# Patient Record
Sex: Male | Born: 1985 | Race: Black or African American | Hispanic: No | Marital: Single | Smoking: Current every day smoker
Health system: Southern US, Community
[De-identification: ages and names within clinical notes are randomized; demographics above are authoritative.]

## PROBLEM LIST (undated history)

## (undated) DIAGNOSIS — J45909 Unspecified asthma, uncomplicated: Secondary | ICD-10-CM

---

## 2015-05-22 ENCOUNTER — Emergency Department (HOSPITAL_COMMUNITY): Payer: Medicaid - Out of State

## 2015-05-22 ENCOUNTER — Emergency Department (HOSPITAL_COMMUNITY)
Admission: EM | Admit: 2015-05-22 | Discharge: 2015-05-22 | Disposition: A | Discharge status: Home / Self Care | Payer: Medicaid - Out of State | Attending: Emergency Medicine | Admitting: Emergency Medicine

## 2015-05-22 ENCOUNTER — Encounter (HOSPITAL_COMMUNITY): Payer: Self-pay | Admitting: Emergency Medicine

## 2015-05-22 DIAGNOSIS — Y929 Unspecified place or not applicable: Secondary | ICD-10-CM | POA: Diagnosis not present

## 2015-05-22 DIAGNOSIS — Y999 Unspecified external cause status: Secondary | ICD-10-CM | POA: Insufficient documentation

## 2015-05-22 DIAGNOSIS — X58XXXA Exposure to other specified factors, initial encounter: Secondary | ICD-10-CM | POA: Insufficient documentation

## 2015-05-22 DIAGNOSIS — J45909 Unspecified asthma, uncomplicated: Secondary | ICD-10-CM | POA: Diagnosis not present

## 2015-05-22 DIAGNOSIS — S0993XA Unspecified injury of face, initial encounter: Secondary | ICD-10-CM | POA: Diagnosis present

## 2015-05-22 DIAGNOSIS — S030XXA Dislocation of jaw, initial encounter: Secondary | ICD-10-CM | POA: Diagnosis not present

## 2015-05-22 DIAGNOSIS — Y939 Activity, unspecified: Secondary | ICD-10-CM | POA: Diagnosis not present

## 2015-05-22 DIAGNOSIS — R6884 Jaw pain: Secondary | ICD-10-CM

## 2015-05-22 DIAGNOSIS — S0300XA Dislocation of jaw, unspecified side, initial encounter: Secondary | ICD-10-CM

## 2015-05-22 DIAGNOSIS — Z79899 Other long term (current) drug therapy: Secondary | ICD-10-CM | POA: Diagnosis not present

## 2015-05-22 HISTORY — DX: Unspecified asthma, uncomplicated: J45.909

## 2015-05-22 MED ORDER — PROPOFOL 10 MG/ML IV BOLUS
INTRAVENOUS | Status: AC | PRN
  Administered 2015-05-22: 20 mg via INTRAVENOUS

## 2015-05-22 MED ORDER — HYDROMORPHONE HCL 1 MG/ML IJ SOLN
0.5000 mg | Freq: Once | INTRAMUSCULAR | Status: AC
  Administered 2015-05-22: 0.5 mg via INTRAVENOUS

## 2015-05-22 MED ORDER — PROPOFOL 10 MG/ML IV BOLUS
INTRAVENOUS | Status: AC
  Filled 2015-05-22: qty 20

## 2015-05-22 MED ORDER — HYDROMORPHONE HCL 1 MG/ML IJ SOLN
INTRAMUSCULAR | Status: AC
  Filled 2015-05-22: qty 1

## 2015-05-22 MED ORDER — ONDANSETRON HCL 4 MG/2ML IJ SOLN
INTRAMUSCULAR | Status: AC
  Filled 2015-05-22: qty 2

## 2015-05-22 MED ORDER — HYDROMORPHONE HCL 1 MG/ML IJ SOLN
1.0000 mg | Freq: Once | INTRAMUSCULAR | Status: AC
  Administered 2015-05-22: 1 mg via INTRAVENOUS
  Filled 2015-05-22: qty 1

## 2015-05-22 MED ORDER — ONDANSETRON HCL 4 MG/2ML IJ SOLN
4.0000 mg | Freq: Once | INTRAMUSCULAR | Status: AC
  Administered 2015-05-22: 4 mg via INTRAVENOUS

## 2015-05-22 MED ORDER — PROPOFOL 10 MG/ML IV BOLUS
INTRAVENOUS | Status: AC | PRN
  Administered 2015-05-22: 40 mg via INTRAVENOUS

## 2015-05-22 NOTE — Discharge Instructions (Signed)
Jaw Dislocation °A jaw dislocation is the displacement of the joint where the upper jaw bone (maxilla) and the lower jaw bone (mandibula) meet (temporomandibular joint). Soon after the dislocation, the jaw muscles tighten. This prevents the mouth from closing normally.  °CAUSES °A jaw dislocation usually is caused by a sudden forceful impact to the jaw. A strong punch in the jaw during a fist fight or a sports injury are examples of causes of jaw dislocation. Another cause is injury due to car or motorcycle accidents. °RISK FACTORS °Although anyone can have a jaw dislocation, some people are more at risk than others. People at increased risk for jaw dislocation include participants in contact sports. °SYMPTOMS °Symptoms of jaw dislocation can vary, depending on the severity of the dislocation. They can include: °· Feeling that your teeth are out of alignment when you bite. °· Inability to close your mouth completely. °· Drooling. °· Extreme pain, with the inability to move your jaw. °DIAGNOSIS °Your caregiver will feel your temporomandibular joints and ask you to move your jaw. Your caregiver also will feel the inside of your mouth to make sure there are no fractures or cuts (lacerations). °TREATMENT °Your caregiver will manipulate your jaw to put it back into place (reduction). If you have any jaw fractures from the dislocation, they usually will be held in place with plates and screws or with wiring.  °HOME CARE INSTRUCTIONS °The following measures can help to reduce pain and hasten the healing process: °· Rest your injured joint. Do not move it. Avoid activities similar to the one that caused your injury. °· Apply ice to your injured joint for 1 to 2 days after your reduction or as directed by your caregiver. Applying ice helps to reduce inflammation and pain. °¨ Put ice in a plastic bag. °¨ Place a towel between your skin and the bag. °¨ Leave the ice on for 15-20 minutes at a time, 03-04 times a day. °· Take  over-the-counter or prescription medication for pain as directed by your caregiver. °Also, your caregiver may instruct you to only have certain foods until your jaw heals. These foods may be soft or liquified so that your jaw does not have to move much to eat them. °SEEK IMMEDIATE MEDICAL CARE IF: °· You have plates and screws or wiring to hold your jaw together that becomes loose or damaged. °· You develop drainage from any of the cuts (incisions) where your wires or plates and screws were placed. °· Your pain becomes worse rather than better. °MAKE SURE YOU: °· Understand these instructions. °· Will watch your condition. °· Will get help right away if you are not doing well or get worse. °Document Released: 12/07/2000 Document Revised: 03/03/2012 Document Reviewed: 05/10/2011 °Exit[employer]® Patient Information ©2015 [employer]. This information is not intended to replace advice given to you by your health care provider. Make sure you discuss any questions you have with your health care provider. ° °

## 2015-05-22 NOTE — ED Notes (Signed)
Given print out of bus route and bus pass along with google map directions of how to get to girlfriends apt after getting off the bus.

## 2015-05-22 NOTE — ED Notes (Signed)
Dislocated jaw s/p yawn, hx of same once before.  Call went out at 0145, 200 fentanyl given en route, ems was with patient for extended period of time due to lack of driver.

## 2015-05-22 NOTE — ED Provider Notes (Signed)
CSN: [id number]     Arrival date & time 05/22/15  [id number] History   First MD Initiated Contact with Patient 05/22/15 0322     This chart was scribed for Loren Raceravid Eziah Negro, MD by Arlan OrganAshley Leger, ED Scribe. This patient was seen in room [facility] and the patient's care was started 3:31 AM.   Chief Complaint  Patient presents with  . Facial Injury   The history is provided by the patient. No language interpreter was used.    HPI Comments: Tommy Howard brought in by EMS is a 29 y.o. male without any pertinent past medical history who presents to the Emergency Department complaining of constant,ongoing, unchanged bilateral jaw pain x 2 hours. Pt was given 200 of fentanyl en route to department. He is unable to close his mouth completely at this time. Tommy Howard admits to previous jaw dislocation approximately 1 year ago. No known allergies to medications. Per EMS patient's symptoms started after yawning. No trauma.  Past Medical History  Diagnosis Date  . Asthma    History reviewed. No pertinent past surgical history. No family history on file. History  Substance Use Topics  . Smoking status: Not on file  . Smokeless tobacco: Not on file  . Alcohol Use: Not on file    Review of Systems  Constitutional: Negative for fever and chills.  HENT: Negative for facial swelling.   Gastrointestinal: Negative for nausea and vomiting.  Musculoskeletal: Positive for arthralgias.  Skin: Negative for rash.  Psychiatric/Behavioral: Negative for confusion.  All other systems reviewed and are negative.     Allergies  Review of patient's allergies indicates no known allergies.  Home Medications   Prior to Admission medications   Medication Sig Start Date End Date Taking? Authorizing Provider  albuterol (PROVENTIL HFA;VENTOLIN HFA) 108 (90 BASE) MCG/ACT inhaler Inhale 2 puffs into the lungs every 6 (six) hours as needed for wheezing or shortness of breath.   Yes Historical Provider, MD   albuterol (PROVENTIL) (2.5 MG/3ML) 0.083% nebulizer solution Take 2.5 mg by nebulization every 6 (six) hours as needed for wheezing or shortness of breath.   Yes Historical Provider, MD   Triage Vitals: BP 129/72 mmHg  Pulse 50  Temp(Src) 97.8 F (36.6 C) (Oral)  Resp 13  Ht 5\' 9"  (1.753 m)  Wt 135 lb (61.236 kg)  BMI 19.93 kg/m2  SpO2 100%   Physical Exam  Constitutional: He is oriented to person, place, and time. He appears well-developed and well-nourished. No distress.  HENT:  Head: Normocephalic and atraumatic.  Mouth/Throat: Oropharynx is clear and moist.  Patient unable to close mouth. No obvious swelling or deformity. Face is symmetric  Eyes: EOM are normal. Pupils are equal, round, and reactive to light.  Neck: Normal range of motion. Neck supple.  Cardiovascular: Normal rate and regular rhythm.   Pulmonary/Chest: Effort normal and breath sounds normal. No respiratory distress. He has no wheezes. He has no rales.  Abdominal: Soft. Bowel sounds are normal. He exhibits no distension and no mass. There is no tenderness. There is no rebound and no guarding.  Musculoskeletal: Normal range of motion. He exhibits no edema or tenderness.  Neurological: He is alert and oriented to person, place, and time.  Moves all extremities. Sensation fully intact.  Skin: Skin is warm and dry. No rash noted. No erythema.  Psychiatric: He has a normal mood and affect. His behavior is normal.  Nursing note and vitals reviewed.   ED Course  Reduction of dislocation Date/Time: 05/22/2015  6:58 AM Performed by: Loren Racer Authorized by: Loren Racer Consent: Verbal consent obtained. Written consent obtained. Patient sedated: yes Sedatives: propofol Sedation start date/time: 05/22/2015 6:15 AM Sedation end date/time: 05/22/2015 6:25 AM Patient tolerance: Patient tolerated the procedure well with no immediate complications Comments: Bilateral mandibular condyle reduction. Patient has  full range of motion of the jaw. No malocclusion.   (including critical care time)  DIAGNOSTIC STUDIES: Oxygen Saturation is 100% on RA, Normal by my interpretation.    COORDINATION OF CARE: 3:32 AM- Will give Dilaudid. Will order DG orthopantogram. Discussed treatment plan with pt at bedside and pt agreed to plan.     Labs Review Labs Reviewed - No data to display  Imaging Review Dg Orthopantogram  05/22/2015   CLINICAL DATA:  Dislocated jaw when E on Ing.  Jaw pain.  EXAM: ORTHOPANTOGRAM/PANORAMIC  COMPARISON:  None.  FINDINGS: There is no evidence of fracture. Temporomandibular joint location is not well established by this technique. No evidence of mandibular fracture.  Lower molar cavities with extensive erosion to the right first molar. There is also a large cavity in the left first mandibular molar, which also has periapical erosion. Periapical erosion also seen around the terminal left lower molar.  IMPRESSION: 1. No negative for mandible fracture. 2. Mandibular molar caries.   Electronically Signed   By: Marnee Spring M.D.   On: 05/22/2015 04:30   Ct Maxillofacial Wo Cm  05/22/2015   CLINICAL DATA:  Facial injury with inability to close mouth. History of previous mandible dislocation.  EXAM: CT MAXILLOFACIAL WITHOUT CONTRAST  TECHNIQUE: Multidetector CT imaging of the maxillofacial structures was performed. Multiplanar CT image reconstructions were also generated. A small metallic BB was placed on the right temple in order to reliably differentiate right from left.  COMPARISON:  None.  FINDINGS: Symmetric anterior translation of the temporomandibular joints, fixed dislocation given the history. There is no fracture or other visible impediment to reduction. No degenerative changes to the TMJs.  There are multiple cavities and periapical erosions, most extensive in the bilateral mandibular molars. There is also a notably large cavity of the left upper first premolar, with periapical  erosion.  Clear paranasal sinuses. No evidence of globe or other orbital pathology. Limited intracranial imaging is negative.  IMPRESSION: Bilateral anterior TMJ dislocation.  No acute fracture.   Electronically Signed   By: Marnee Spring M.D.   On: 05/22/2015 05:58     EKG Interpretation None      MDM   Final diagnoses:  Jaw pain  TMJ (dislocation of temporomandibular joint), initial encounter    I personally performed the services described in this documentation, which was scribed in my presence. The recorded information has been reviewed and is accurate.  Patient with mild hypoxia during sedation which responded to supplemental oxygen with bag valve mask.  Patient is now alert. Speaking clearly. No malocclusion. Discharge home.  Loren Racer, MD 05/22/15 [phone]

## 2015-09-19 ENCOUNTER — Emergency Department (HOSPITAL_COMMUNITY): Payer: Medicaid - Out of State

## 2015-09-19 ENCOUNTER — Encounter (HOSPITAL_COMMUNITY): Payer: Self-pay | Admitting: Emergency Medicine

## 2015-09-19 ENCOUNTER — Emergency Department (HOSPITAL_COMMUNITY)
Admission: EM | Admit: 2015-09-19 | Discharge: 2015-09-19 | Disposition: A | Discharge status: Home / Self Care | Payer: Medicaid - Out of State | Attending: Emergency Medicine | Admitting: Emergency Medicine

## 2015-09-19 DIAGNOSIS — Z720 Tobacco use: Secondary | ICD-10-CM | POA: Diagnosis not present

## 2015-09-19 DIAGNOSIS — Z79899 Other long term (current) drug therapy: Secondary | ICD-10-CM | POA: Diagnosis not present

## 2015-09-19 DIAGNOSIS — R6884 Jaw pain: Secondary | ICD-10-CM | POA: Diagnosis present

## 2015-09-19 DIAGNOSIS — K029 Dental caries, unspecified: Secondary | ICD-10-CM | POA: Diagnosis not present

## 2015-09-19 DIAGNOSIS — M2660 Temporomandibular joint disorder, unspecified: Secondary | ICD-10-CM | POA: Insufficient documentation

## 2015-09-19 DIAGNOSIS — J45909 Unspecified asthma, uncomplicated: Secondary | ICD-10-CM | POA: Diagnosis not present

## 2015-09-19 DIAGNOSIS — M26629 Arthralgia of temporomandibular joint, unspecified side: Secondary | ICD-10-CM

## 2015-09-19 MED ORDER — KETOROLAC TROMETHAMINE 60 MG/2ML IM SOLN
60.0000 mg | Freq: Once | INTRAMUSCULAR | Status: AC
  Administered 2015-09-19: 60 mg via INTRAMUSCULAR
  Filled 2015-09-19: qty 2

## 2015-09-19 MED ORDER — ETODOLAC 500 MG PO TABS: 500.0000 mg | ORAL_TABLET | Freq: Two times a day (BID) | ORAL | Status: AC

## 2015-09-19 NOTE — ED Provider Notes (Signed)
CSN: [id number]     Arrival date & time 09/19/15  4098 History   First MD Initiated Contact with Patient 09/19/15 [phone]     Chief Complaint  Patient presents with  . Jaw Pain   HPI Patient presents to the emergency room complaining of right-sided jaw pain.  Patient states he dislocated his jaw yesterday. There was no trauma he just felt like it popped out of place. Patient was able to relocate his jaw on his own. Since yesterday he's continued to have a painful popping discomfort on the right side of his jaw. Movement increases the pain. Patient called 911 to be brought to the emergency room. They observed the patient yawning without injury. Patient denies any fevers or chills. He denies any toothache. He denies any earache. Past Medical History  Diagnosis Date  . Asthma    History reviewed. No pertinent past surgical history. History reviewed. No pertinent family history. Social History  Substance Use Topics  . Smoking status: Current Every Day Smoker    Types: Cigars  . Smokeless tobacco: Never Used  . Alcohol Use: No    Review of Systems  All other systems reviewed and are negative.     Allergies  Review of patient's allergies indicates no known allergies.  Home Medications   Prior to Admission medications   Medication Sig Start Date End Date Taking? Authorizing Provider  acetaminophen (TYLENOL) 500 MG tablet Take 2,000 mg by mouth every 6 (six) hours as needed for mild pain, moderate pain or headache.   Yes Historical Provider, MD  albuterol (PROVENTIL HFA;VENTOLIN HFA) 108 (90 BASE) MCG/ACT inhaler Inhale 2 puffs into the lungs every 6 (six) hours as needed for wheezing or shortness of breath.   Yes Historical Provider, MD  albuterol (PROVENTIL) (2.5 MG/3ML) 0.083% nebulizer solution Take 2.5 mg by nebulization every 6 (six) hours as needed for wheezing or shortness of breath.   Yes Historical Provider, MD  etodolac (LODINE) 500 MG tablet Take 1 tablet (500 mg total) by  mouth 2 (two) times daily. 09/19/15   Linwood Dibbles, MD   BP 107/66 mmHg  Pulse 68  Temp(Src) 98.1 F (36.7 C) (Oral)  Resp 14  Ht  (1.753 m)  Wt 135 lb (61.236 kg)  BMI 19.93 kg/m2  SpO2 96% Physical Exam  Constitutional: He appears well-developed and well-nourished. No distress.  HENT:  Head: Normocephalic and atraumatic.  Right Ear: Tympanic membrane and external ear normal.  Left Ear: Tympanic membrane and external ear normal.  Mouth/Throat: Mucous membranes are normal. No oral lesions. No trismus in the jaw. Dental caries (no dental tenderness) present. No uvula swelling. No oropharyngeal exudate, posterior oropharyngeal edema, posterior oropharyngeal erythema or tonsillar abscesses.  Tenderness to palpation right TMJ, no erythema or swelling, no malocclusion  Eyes: Conjunctivae are normal. Right eye exhibits no discharge. Left eye exhibits no discharge. No scleral icterus.  Neck: Neck supple. No tracheal deviation present.  Cardiovascular: Normal rate.   Pulmonary/Chest: Effort normal. No stridor. No respiratory distress.  Musculoskeletal: He exhibits no edema.  Neurological: He is alert. Cranial nerve deficit: no gross deficits.  Skin: Skin is warm and dry. No rash noted.  Psychiatric: He has a normal mood and affect.  Nursing note and vitals reviewed.   ED Course  Procedures (including critical care time)    Imaging Review Dg Mandible 1-3 Views  09/19/2015   CLINICAL DATA:  History of mandible dislocation 2 years ago and felt like it pop out yesterday.  Right sided mandibular pain.  EXAM: MANDIBLE - 1-3 VIEW  COMPARISON:  None.  FINDINGS: There is no evidence of fracture or other focal bone lesions. Visualized paranasal sinuses are aerated. Mandibular condyles appear to be located.  IMPRESSION: No acute abnormality.   Electronically Signed   By: Richarda Overlie M.D.   On: 09/19/2015 08:12     MDM   Final diagnoses:  Temporomandibular joint (TMJ) pain   X-rays do not  show any abnormality. Exam does not suggest any infection or other referred pain. Patient may have TMJ joint dysfunction. I recommend follow-up with a dentist. Prescription for NSAIDs for pain relief.  At this time there does not appear to be any evidence of an acute emergency medical condition and the patient appears stable for discharge with appropriate outpatient follow up.     Linwood Dibbles, MD 09/19/15 [phone]

## 2015-09-19 NOTE — Discharge Instructions (Signed)
Temporomandibular Problems  Temporomandibular joint (TMJ) dysfunction means there are problems with the joint between your jaw and your skull. This is a joint lined by cartilage like other joints in your body but also has a small disc in the joint which keeps the bones from rubbing on each other. These joints are like other joints and can get inflamed (sore) from arthritis and other problems. When this joint gets sore, it can cause headaches and pain in the jaw and the face. CAUSES  Usually the arthritic types of problems are caused by soreness in the joint. Soreness in the joint can also be caused by overuse. This may come from grinding your teeth. It may also come from mis-alignment in the joint. DIAGNOSIS Diagnosis of this condition can often be made by history and exam. Sometimes your caregiver may need X-rays or an MRI scan to determine the exact cause. It may be necessary to see your dentist to determine if your teeth and jaws are lined up correctly. TREATMENT  Most of the time this problem is not serious; however, sometimes it can persist (become chronic). When this happens medications that will cut down on inflammation (soreness) help. Sometimes a shot of cortisone into the joint will be helpful. If your teeth are not aligned it may help for your dentist to make a splint for your mouth that can help this problem. If no physical problems can be found, the problem may come from tension. If tension is found to be the cause, biofeedback or relaxation techniques may be helpful. HOME CARE INSTRUCTIONS   Later in the day, applications of ice packs may be helpful. Ice can be used in a plastic bag with a towel around it to prevent frostbite to skin. This may be used about every 2 hours for 20 to 30 minutes, as needed while awake, or as directed by your caregiver.  Only take over-the-counter or prescription medicines for pain, discomfort, or fever as directed by your caregiver.  If physical therapy was  prescribed, follow your caregiver's directions.  Wear mouth appliances as directed if they were given. Document Released: 09/04/2001 Document Revised: 03/03/2012 Document Reviewed: 12/12/2008 Exit[employer] Patient Information 2015 [employer]. This information is not intended to replace advice given to you by your health care provider. Make sure you discuss any questions you have with your health care provider.  

## 2015-09-19 NOTE — ED Notes (Addendum)
Per EMS, pt says he dislocated his jaw and put it back yesterday. Now it's popping and painful to the R side. Ambulatory on scene for EMS, who also states pt yawning in their presence without painful reaction.

## 2015-09-19 NOTE — ED Notes (Signed)
Patient transported to X-ray 

## 2015-09-20 ENCOUNTER — Encounter (HOSPITAL_COMMUNITY): Payer: Self-pay | Admitting: Emergency Medicine

## 2015-09-20 ENCOUNTER — Emergency Department (HOSPITAL_COMMUNITY)
Admission: EM | Admit: 2015-09-20 | Discharge: 2015-09-20 | Disposition: A | Discharge status: Home / Self Care | Payer: Medicaid - Out of State | Attending: Emergency Medicine | Admitting: Emergency Medicine

## 2015-09-20 DIAGNOSIS — R6884 Jaw pain: Secondary | ICD-10-CM

## 2015-09-20 DIAGNOSIS — J45909 Unspecified asthma, uncomplicated: Secondary | ICD-10-CM | POA: Diagnosis not present

## 2015-09-20 DIAGNOSIS — Z720 Tobacco use: Secondary | ICD-10-CM | POA: Insufficient documentation

## 2015-09-20 DIAGNOSIS — Z79899 Other long term (current) drug therapy: Secondary | ICD-10-CM | POA: Diagnosis not present

## 2015-09-20 MED ORDER — KETOROLAC TROMETHAMINE 30 MG/ML IJ SOLN
60.0000 mg | Freq: Once | INTRAMUSCULAR | Status: AC
  Administered 2015-09-20: 60 mg via INTRAMUSCULAR
  Filled 2015-09-20: qty 2

## 2015-09-20 NOTE — ED Provider Notes (Signed)
CSN: [id number]     Arrival date & time 09/20/15  4098 History   First MD Initiated Contact with Patient 09/20/15 [phone]     Chief Complaint  Patient presents with  . Jaw Pain    The patient says he has problems with his jaw dislocating.  Tonight he yawned and it popped out of place.  He popped it back into place and now he is complaining of jaw pain.     (Consider location/radiation/quality/duration/timing/severity/associated sxs/prior Treatment) Patient is a 29 y.o. male presenting with mouth injury. The history is provided by the patient.  Mouth Injury This is a recurrent problem. The current episode started 12 to 24 hours ago. The problem occurs rarely. The problem has not changed since onset.Pertinent negatives include no chest pain, no abdominal pain, no headaches and no shortness of breath. Nothing aggravates the symptoms. Nothing relieves the symptoms. He has tried nothing for the symptoms. The treatment provided no relief.  dislocated and relocated his jaw.  Has not taken his meds or followed up .  Jaw is currently in place  Past Medical History  Diagnosis Date  . Asthma    History reviewed. No pertinent past surgical history. History reviewed. No pertinent family history. Social History  Substance Use Topics  . Smoking status: Current Every Day Smoker    Types: Cigars  . Smokeless tobacco: Never Used  . Alcohol Use: No    Review of Systems  Constitutional: Negative for fever.  Respiratory: Negative for shortness of breath.   Cardiovascular: Negative for chest pain.  Gastrointestinal: Negative for abdominal pain.  Neurological: Negative for headaches.  All other systems reviewed and are negative.     Allergies  Review of patient's allergies indicates no known allergies.  Home Medications   Prior to Admission medications   Medication Sig Start Date End Date Taking? Authorizing Provider  acetaminophen (TYLENOL) 500 MG tablet Take 2,000 mg by mouth every 6 (six)  hours as needed for mild pain, moderate pain or headache.    Historical Provider, MD  albuterol (PROVENTIL HFA;VENTOLIN HFA) 108 (90 BASE) MCG/ACT inhaler Inhale 2 puffs into the lungs every 6 (six) hours as needed for wheezing or shortness of breath.    Historical Provider, MD  albuterol (PROVENTIL) (2.5 MG/3ML) 0.083% nebulizer solution Take 2.5 mg by nebulization every 6 (six) hours as needed for wheezing or shortness of breath.    Historical Provider, MD  etodolac (LODINE) 500 MG tablet Take 1 tablet (500 mg total) by mouth 2 (two) times daily. 09/19/15   Linwood Dibbles, MD   BP 104/67 mmHg  Pulse 49  Temp(Src) 97.9 F (36.6 C) (Oral)  Resp 16  SpO2 97% Physical Exam  Constitutional: He is oriented to person, place, and time. He appears well-developed and well-nourished.  HENT:  Head: Normocephalic and atraumatic.  Mouth/Throat: Oropharynx is clear and moist. No trismus in the jaw. No oropharyngeal exudate.  B condyles seated normal excursion  Eyes: Conjunctivae and EOM are normal. Pupils are equal, round, and reactive to light.  Neck: Normal range of motion. Neck supple.  Cardiovascular: Normal rate, regular rhythm and intact distal pulses.   Pulmonary/Chest: Effort normal and breath sounds normal. No respiratory distress. He has no wheezes. He has no rales.  Abdominal: Soft. Bowel sounds are normal. There is no tenderness. There is no rebound and no guarding.  Musculoskeletal: Normal range of motion.  Neurological: He is alert and oriented to person, place, and time.  Skin: Skin is warm  and dry.  Psychiatric: He has a normal mood and affect.    ED Course  Procedures (including critical care time) Labs Review Labs Reviewed - No data to display  Imaging Review Dg Mandible 1-3 Views  09/19/2015   CLINICAL DATA:  History of mandible dislocation 2 years ago and felt like it pop out yesterday. Right sided mandibular pain.  EXAM: MANDIBLE - 1-3 VIEW  COMPARISON:  None.  FINDINGS: There is  no evidence of fracture or other focal bone lesions. Visualized paranasal sinuses are aerated. Mandibular condyles appear to be located.  IMPRESSION: No acute abnormality.   Electronically Signed   By: Richarda Overlie M.D.   On: 09/19/2015 08:12   I have personally reviewed and evaluated these images and lab results as part of my medical decision-making.   EKG Interpretation None      MDM   Final diagnoses:  None    Hasn't followed up with dentistry or filled his meds.  The jaw is well seated.  He is sleeping in the room and there are no visual signs of pain.  Safe for discharge.      Cy Blamer, MD 09/20/15 [phone]

## 2015-09-20 NOTE — ED Notes (Signed)
The patient says he has problems with his jaw dislocating.  Tonight he yawned and it popped out of place.  He popped it back into place and now he is complaining of jaw pain.  He rates his pain 10/10.

## 2015-09-20 NOTE — ED Notes (Signed)
Dr. Palumbo at bedside. 

## 2015-09-29 ENCOUNTER — Encounter (HOSPITAL_COMMUNITY): Payer: Self-pay | Admitting: *Deleted

## 2015-09-29 ENCOUNTER — Emergency Department (HOSPITAL_COMMUNITY)
Admission: EM | Admit: 2015-09-29 | Discharge: 2015-09-29 | Disposition: A | Discharge status: Home / Self Care | Payer: Medicaid - Out of State | Attending: Emergency Medicine | Admitting: Emergency Medicine

## 2015-09-29 DIAGNOSIS — R6884 Jaw pain: Secondary | ICD-10-CM | POA: Diagnosis not present

## 2015-09-29 DIAGNOSIS — Z79899 Other long term (current) drug therapy: Secondary | ICD-10-CM | POA: Insufficient documentation

## 2015-09-29 DIAGNOSIS — J45909 Unspecified asthma, uncomplicated: Secondary | ICD-10-CM | POA: Insufficient documentation

## 2015-09-29 DIAGNOSIS — Z720 Tobacco use: Secondary | ICD-10-CM | POA: Diagnosis not present

## 2015-09-29 MED ORDER — IBUPROFEN 800 MG PO TABS
800.0000 mg | ORAL_TABLET | Freq: Once | ORAL | Status: AC
  Administered 2015-09-29: 800 mg via ORAL
  Filled 2015-09-29: qty 1

## 2015-09-29 NOTE — ED Provider Notes (Signed)
CSN: [id number]     Arrival date & time 09/29/15  0234 History  By signing my name below, I, Freida Busman, attest that this documentation has been prepared under the direction and in the presence of Zadie Rhine, MD . Electronically Signed: Freida Busman, Scribe. 09/29/2015. 3:07 AM. Chief Complaint  Patient presents with  . Jaw Pain   The history is provided by the patient. No language interpreter was used.     HPI Comments:  Tommy Howard is a 29 y.o. 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 Pt states he felt his jaw pop while yawning and was able to pop it back in. He denies fever and vomiting. No alleviating factors noted. Pt was seen in the ED on 09/20/15 for the same, states he has a dental appointment scheduled on 09/30/15.  Past Medical History  Diagnosis Date  . Asthma    History reviewed. No pertinent past surgical history. No family history on file. Social History  Substance Use Topics  . Smoking status: Current Every Day Smoker    Types: Cigars  . Smokeless tobacco: Never Used  . Alcohol Use: No    Review of Systems  Constitutional: Negative for fever.  Gastrointestinal: Negative for vomiting.  Musculoskeletal: Positive for arthralgias (Jaw pain).   Allergies  Review of patient's allergies indicates no known allergies.  Home Medications   Prior to Admission medications   Medication Sig Start Date End Date Taking? Authorizing Provider  acetaminophen (TYLENOL) 500 MG tablet Take 2,000 mg by mouth every 6 (six) hours as needed for mild pain, moderate pain or headache.    Historical Provider, MD  albuterol (PROVENTIL HFA;VENTOLIN HFA) 108 (90 BASE) MCG/ACT inhaler Inhale 2 puffs into the lungs every 6 (six) hours as needed for wheezing or shortness of breath.    Historical Provider, MD  albuterol (PROVENTIL) (2.5 MG/3ML) 0.083% nebulizer solution Take 2.5 mg by nebulization  every 6 (six) hours as needed for wheezing or shortness of breath.    Historical Provider, MD  etodolac (LODINE) 500 MG tablet Take 1 tablet (500 mg total) by mouth 2 (two) times daily. 09/19/15   Linwood Dibbles, MD   BP 110/74 mmHg  Pulse 59  Temp(Src) 97.7 F (36.5 C) (Oral)  Resp 18  SpO2 100% Physical Exam CONSTITUTIONAL: Well developed/well nourished HEAD AND FACE: Normocephalic/atraumatic EYES: EOMI/PERRL ENMT: Mucous membranes moist.  Poor dentition.  No trismus.  No focal abscess noted. No malocclusion. No facial swelling noted.  NECK: supple no meningeal signs CV: S1/S2 noted, no murmurs/rubs/gallops noted LUNGS: Lungs are clear to auscultation bilaterally, no apparent distress NEURO: Pt is awake/alert, moves all extremitiesx4 EXTREMITIES:full ROM SKIN: warm, color normal  ED Course  Procedures   DIAGNOSTIC STUDIES:  Oxygen Saturation is 100% on RA, normal by my interpretation.    COORDINATION OF CARE:  2:55 AM Discussed treatment plan with pt at bedside and pt agreed to plan.  Advised to f/u as outpatient No signs of jaw dislocation No signs of dental abscess or deep space infection to mouth Stable for d/c home   MDM   Final diagnoses:  Jaw pain    Nursing notes including past medical history and social history reviewed and considered in documentation   I, Joya Gaskins, personally performed the services described in this documentation. All medical record entries made by the scribe were at my direction and in my presence.  I have  reviewed the chart and discharge instructions and agree that the record reflects my personal performance and is accurate and complete. Joya Gaskins.  09/29/2015. 3:14 AM.       Zadie Rhine, MD 09/29/15 [phone]

## 2015-09-29 NOTE — ED Notes (Addendum)
Pt arrives to ED via EMS. Pt states that he was yawning and felt his jaw pop out of place. States he put it back in place but now it hurts. States he has a hx of TMJ and this happens often. Pt able to speak without difficulty.

## 2015-09-30 ENCOUNTER — Emergency Department (HOSPITAL_COMMUNITY)
Admission: EM | Admit: 2015-09-30 | Discharge: 2015-10-01 | Disposition: A | Discharge status: Home / Self Care | Payer: Medicaid - Out of State | Attending: Emergency Medicine | Admitting: Emergency Medicine

## 2015-09-30 ENCOUNTER — Encounter (HOSPITAL_COMMUNITY): Payer: Self-pay | Admitting: Nurse Practitioner

## 2015-09-30 ENCOUNTER — Emergency Department (HOSPITAL_COMMUNITY): Payer: Medicaid - Out of State

## 2015-09-30 DIAGNOSIS — Z720 Tobacco use: Secondary | ICD-10-CM | POA: Diagnosis not present

## 2015-09-30 DIAGNOSIS — X58XXXA Exposure to other specified factors, initial encounter: Secondary | ICD-10-CM | POA: Insufficient documentation

## 2015-09-30 DIAGNOSIS — Y9389 Activity, other specified: Secondary | ICD-10-CM | POA: Diagnosis not present

## 2015-09-30 DIAGNOSIS — S0993XA Unspecified injury of face, initial encounter: Secondary | ICD-10-CM | POA: Diagnosis present

## 2015-09-30 DIAGNOSIS — Y998 Other external cause status: Secondary | ICD-10-CM | POA: Insufficient documentation

## 2015-09-30 DIAGNOSIS — R6884 Jaw pain: Secondary | ICD-10-CM

## 2015-09-30 DIAGNOSIS — Y9289 Other specified places as the place of occurrence of the external cause: Secondary | ICD-10-CM | POA: Insufficient documentation

## 2015-09-30 DIAGNOSIS — Z79899 Other long term (current) drug therapy: Secondary | ICD-10-CM | POA: Diagnosis not present

## 2015-09-30 DIAGNOSIS — S0303XA Dislocation of jaw, bilateral, initial encounter: Secondary | ICD-10-CM | POA: Diagnosis not present

## 2015-09-30 DIAGNOSIS — S0300XA Dislocation of jaw, unspecified side, initial encounter: Secondary | ICD-10-CM

## 2015-09-30 DIAGNOSIS — J45909 Unspecified asthma, uncomplicated: Secondary | ICD-10-CM | POA: Insufficient documentation

## 2015-09-30 LAB — CBC WITH DIFFERENTIAL/PLATELET
Basophils Absolute: 0 10*3/uL (ref 0.0–0.1)
Basophils Relative: 1 %
EOS PCT: 2 %
Eosinophils Absolute: 0.1 10*3/uL (ref 0.0–0.7)
HEMATOCRIT: 42.8 % (ref 39.0–52.0)
HEMOGLOBIN: 13.4 g/dL (ref 13.0–17.0)
LYMPHS ABS: 1.6 10*3/uL (ref 0.7–4.0)
LYMPHS PCT: 59 %
MCH: 24.6 pg — ABNORMAL LOW (ref 26.0–34.0)
MCHC: 31.3 g/dL (ref 30.0–36.0)
MCV: 78.5 fL (ref 78.0–100.0)
MONOS PCT: 8 %
Monocytes Absolute: 0.2 10*3/uL (ref 0.1–1.0)
Neutro Abs: 0.8 10*3/uL — ABNORMAL LOW (ref 1.7–7.7)
Neutrophils Relative %: 30 %
Platelets: 99 10*3/uL — ABNORMAL LOW (ref 150–400)
RBC: 5.45 MIL/uL (ref 4.22–5.81)
RDW: 14.5 % (ref 11.5–15.5)
WBC: 2.7 10*3/uL — AB (ref 4.0–10.5)

## 2015-09-30 LAB — COMPREHENSIVE METABOLIC PANEL
ALT: 33 U/L (ref 17–63)
ANION GAP: 7 (ref 5–15)
AST: 38 U/L (ref 15–41)
Albumin: 3.6 g/dL (ref 3.5–5.0)
Alkaline Phosphatase: 48 U/L (ref 38–126)
BUN: 8 mg/dL (ref 6–20)
CO2: 24 mmol/L (ref 22–32)
Calcium: 8.9 mg/dL (ref 8.9–10.3)
Chloride: 106 mmol/L (ref 101–111)
Creatinine, Ser: 0.83 mg/dL (ref 0.61–1.24)
GFR calc Af Amer: >60 mL/min (ref >60)
Glucose, Bld: 114 mg/dL — ABNORMAL HIGH (ref 65–99)
POTASSIUM: 4.2 mmol/L (ref 3.5–5.1)
Sodium: 137 mmol/L (ref 135–145)
Total Bilirubin: 0.6 mg/dL (ref 0.3–1.2)
Total Protein: 7.3 g/dL (ref 6.5–8.1)

## 2015-09-30 LAB — LIPASE, BLOOD: Lipase: 21 U/L — ABNORMAL LOW (ref 22–51)

## 2015-09-30 MED ORDER — KETAMINE HCL 10 MG/ML IJ SOLN
INTRAMUSCULAR | Status: AC | PRN
  Administered 2015-09-30 (×2): 30 mg via INTRAVENOUS

## 2015-09-30 MED ORDER — FENTANYL CITRATE (PF) 100 MCG/2ML IJ SOLN
INTRAMUSCULAR | Status: AC
  Administered 2015-09-30: 50 ug via INTRAVENOUS
  Filled 2015-09-30: qty 2

## 2015-09-30 MED ORDER — ONDANSETRON HCL 4 MG/2ML IJ SOLN
4.0000 mg | Freq: Once | INTRAMUSCULAR | Status: AC
  Administered 2015-09-30: 4 mg via INTRAVENOUS

## 2015-09-30 MED ORDER — FENTANYL CITRATE (PF) 100 MCG/2ML IJ SOLN: 50.0000 ug | Freq: Once | INTRAMUSCULAR | Status: DC

## 2015-09-30 MED ORDER — ONDANSETRON HCL 4 MG/2ML IJ SOLN: 4.0000 mg | Freq: Once | INTRAMUSCULAR | Status: DC

## 2015-09-30 MED ORDER — KETAMINE HCL 10 MG/ML IJ SOLN
1.0000 mg/kg | Freq: Once | INTRAMUSCULAR | Status: DC
  Filled 2015-09-30: qty 6.1

## 2015-09-30 MED ORDER — PROPOFOL 10 MG/ML IV BOLUS
INTRAVENOUS | Status: AC | PRN
  Administered 2015-09-30: 10 mg via INTRAVENOUS

## 2015-09-30 MED ORDER — PROPOFOL 10 MG/ML IV BOLUS
INTRAVENOUS | Status: AC | PRN
  Administered 2015-09-30 (×3): 30 mg via INTRAVENOUS
  Administered 2015-09-30: 20 mg via INTRAVENOUS
  Administered 2015-09-30: 30 mg via INTRAVENOUS
  Administered 2015-09-30: 10 mg via INTRAVENOUS
  Administered 2015-09-30: 30 mg via INTRAVENOUS

## 2015-09-30 MED ORDER — DIAZEPAM 5 MG/ML IJ SOLN
2.5000 mg | Freq: Once | INTRAMUSCULAR | Status: AC
  Administered 2015-09-30: 2.5 mg via INTRAVENOUS
  Filled 2015-09-30: qty 2

## 2015-09-30 MED ORDER — KETAMINE HCL 10 MG/ML IJ SOLN
INTRAMUSCULAR | Status: AC | PRN
  Administered 2015-09-30: 10 mg via INTRAVENOUS

## 2015-09-30 MED ORDER — FENTANYL CITRATE (PF) 100 MCG/2ML IJ SOLN
50.0000 ug | Freq: Once | INTRAMUSCULAR | Status: AC
  Administered 2015-09-30: 50 ug via INTRAVENOUS

## 2015-09-30 MED ORDER — PROPOFOL 10 MG/ML IV BOLUS
1.0000 mg/kg | Freq: Once | INTRAVENOUS | Status: AC
  Administered 2015-09-30: 61.2 mg via INTRAVENOUS
  Filled 2015-09-30: qty 20

## 2015-09-30 MED ORDER — ONDANSETRON HCL 4 MG/2ML IJ SOLN
4.0000 mg | Freq: Once | INTRAMUSCULAR | Status: DC
  Filled 2015-09-30: qty 2

## 2015-09-30 MED ORDER — SODIUM CHLORIDE 0.9 % IV BOLUS (SEPSIS)
1000.0000 mL | Freq: Once | INTRAVENOUS | Status: AC
  Administered 2015-09-30: 1000 mL via INTRAVENOUS

## 2015-09-30 MED ORDER — PROPOFOL 10 MG/ML IV BOLUS
1.0000 mg/kg | Freq: Once | INTRAVENOUS | Status: DC
  Filled 2015-09-30: qty 20

## 2015-09-30 MED ORDER — FENTANYL CITRATE (PF) 100 MCG/2ML IJ SOLN
50.0000 ug | Freq: Once | INTRAMUSCULAR | Status: AC
  Administered 2015-09-30: 50 ug via INTRAVENOUS
  Filled 2015-09-30: qty 2

## 2015-09-30 NOTE — ED Notes (Signed)
He was vomiting and his jaw popped out of place. Reports hx jaw dislocation.

## 2015-09-30 NOTE — ED Notes (Signed)
Ketamine given back to Pharmacist at this time.

## 2015-09-30 NOTE — Progress Notes (Signed)
Ketamine delivered to nurse, Benjaman Pott.  Arlean Hopping. Newman Pies, PharmD Clinical Pharmacist Pager [phone]

## 2015-09-30 NOTE — ED Provider Notes (Signed)
CSN: [id number]     Arrival date & time 09/30/15  1438 History   First MD Initiated Contact with Patient 09/30/15 1512     Chief Complaint  Patient presents with  . Jaw Pain      The history is provided by the patient. No language interpreter was used.   Tommy Howard presents for evaluation of jaw pain.  He has a hx/o ongoing jaw pain and frequent dislocations.  Today he thinks he ate some bad food and then developed lower abdominal pain and cramping followed by vomiting.  When he was vomiting his jaw popped out of place.  This happened about 1.5 hours prior to ED arrival.  He reports pain throughout both sides of his jaw.  He denies fevers, diarrhea, constipation, dysuria.    Past Medical History  Diagnosis Date  . Asthma    History reviewed. No pertinent past surgical history. History reviewed. No pertinent family history. Social History  Substance Use Topics  . Smoking status: Current Every Day Smoker    Types: Cigars  . Smokeless tobacco: Never Used  . Alcohol Use: No    Review of Systems  All other systems reviewed and are negative.     Allergies  Review of patient's allergies indicates no known allergies.  Home Medications   Prior to Admission medications   Medication Sig Start Date End Date Taking? Authorizing Provider  acetaminophen (TYLENOL) 500 MG tablet Take 2,000 mg by mouth every 6 (six) hours as needed for mild pain, moderate pain or headache.    Historical Provider, MD  albuterol (PROVENTIL HFA;VENTOLIN HFA) 108 (90 BASE) MCG/ACT inhaler Inhale 2 puffs into the lungs every 6 (six) hours as needed for wheezing or shortness of breath.    Historical Provider, MD  albuterol (PROVENTIL) (2.5 MG/3ML) 0.083% nebulizer solution Take 2.5 mg by nebulization every 6 (six) hours as needed for wheezing or shortness of breath.    Historical Provider, MD  etodolac (LODINE) 500 MG tablet Take 1 tablet (500 mg total) by mouth 2 (two) times daily. 09/19/15   Linwood Dibbles, MD    BP 118/73 mmHg  Pulse 86  Temp(Src)   Resp 20  SpO2 100% Physical Exam  Constitutional: He is oriented to person, place, and time. He appears well-developed and well-nourished.  HENT:  Head: Normocephalic and atraumatic.  Jaw fixed open with empty mandibular fossas bilaterally.  TTP over TMJ bilaterally.   Eyes: Pupils are equal, round, and reactive to light.  Cardiovascular: Normal rate and regular rhythm.   No murmur heard. Pulmonary/Chest: Effort normal and breath sounds normal. No respiratory distress.  Abdominal: Soft. There is no rebound and no guarding.  Mild epigastric tenderness.   Musculoskeletal: He exhibits no edema or tenderness.  Neurological: He is alert and oriented to person, place, and time.  Skin: Skin is warm and dry.  Psychiatric: He has a normal mood and affect. His behavior is normal.  Nursing note and vitals reviewed.   ED Course  Procedures   Procedural sedation Performed by: Tilden Fossa Consent: Verbal consent obtained. Risks and benefits: risks, benefits and alternatives were discussed Required items: required blood products, implants, devices, and special equipment available Patient identity confirmed: arm band and provided demographic data Time out: Immediately prior to procedure a "time out" was called to verify the correct patient, procedure, equipment, support staff and site/side marked as required.  Sedation type: moderate (conscious) sedation NPO time confirmed and considedered  Sedatives: PROPOFOL  Physician Time at Bedside:  30 minutes  Vitals: Vital signs were monitored during sedation. Cardiac Monitor, pulse oximeter Patient tolerance: Patient tolerated the procedure well with no immediate complications.  Unsuccessful reduction attempt. Comments: Pt with uneventful recovered. Returned to pre-procedural sedation baseline  Reduction of dislocation Date/Time: 12:16 AM Performed by: Tilden Fossa Authorized by: Tilden Fossa Consent: Verbal consent obtained. Risks and benefits: risks, benefits and alternatives were discussed Consent given by: patient Required items: required blood products, implants, devices, and special equipment available Time out: Immediately prior to procedure a "time out" was called to verify the correct patient, procedure, equipment, support staff and site/side marked as required.  Patient sedated:with propofol  Vitals: Vital signs were monitored during sedation. Patient tolerance: Patient tolerated the procedure well with no immediate complications. Joint: TMJ Reduction technique: attempted intraoral and extraoral approach without success.        Labs Review Labs Reviewed  COMPREHENSIVE METABOLIC PANEL - Abnormal; Notable for the following:    Glucose, Bld 114 (*)    All other components within normal limits  CBC WITH DIFFERENTIAL/PLATELET - Abnormal; Notable for the following:    WBC 2.7 (*)    MCH 24.6 (*)    Platelets 99 (*)    Neutro Abs 0.8 (*)    All other components within normal limits  LIPASE, BLOOD - Abnormal; Notable for the following:    Lipase 21 (*)    All other components within normal limits    Imaging Review No results found. I have personally reviewed and evaluated these images and lab results as part of my medical decision-making.   EKG Interpretation None      MDM   Final diagnoses:  Jaw pain  Jaw dislocation, initial encounter    Patient with history of TMJ dysfunction here for evaluation of jaw dislocation following episode of emesis. Initially with epigastric tenderness on examination, this did improve in the department. Attempted multiple times to reduce jaw without success. Discussed with Tommy Howard, who also attempted reduction without success.  ENT consulted, Tommy Howard - this is not in his scope of practice, recommends consultation with oral surgery.  Patient care transferred to Tommy Howard for additional reduction attempt.      Tilden Fossa, MD 10/01/15 0020

## 2015-10-01 MED ORDER — HYDROCODONE-ACETAMINOPHEN 5-325 MG PO TABS: 1.0000 | ORAL_TABLET | Freq: Four times a day (QID) | ORAL | Status: AC | PRN

## 2015-10-01 MED ORDER — IBUPROFEN 600 MG PO TABS: 600.0000 mg | ORAL_TABLET | Freq: Four times a day (QID) | ORAL | Status: DC | PRN

## 2015-10-01 MED ORDER — ALBUTEROL SULFATE (2.5 MG/3ML) 0.083% IN NEBU
5.0000 mg | INHALATION_SOLUTION | Freq: Once | RESPIRATORY_TRACT | Status: AC
  Administered 2015-10-01: 5 mg via RESPIRATORY_TRACT
  Filled 2015-10-01: qty 6

## 2015-10-01 MED ORDER — ALBUTEROL SULFATE (2.5 MG/3ML) 0.083% IN NEBU: 2.5000 mg | INHALATION_SOLUTION | Freq: Once | RESPIRATORY_TRACT | Status: DC

## 2015-10-01 NOTE — Discharge Instructions (Signed)
Please see the Oral Surgeon as soon as possible. PAIN WILL BE WORSE THE NEXT 2 DAYS. Return to the ER if you dislocate again. Try not to get the jaw dislocated - clear liquid diet for 2 days no biting into foods like apple. ICE THE JAW.   Jaw Dislocation A jaw dislocation is the displacement of the joint where the upper jaw bone (maxilla) and the lower jaw bone (mandibula) meet (temporomandibular joint). Soon after the dislocation, the jaw muscles tighten. This prevents the mouth from closing normally.  CAUSES A jaw dislocation usually is caused by a sudden forceful impact to the jaw. A strong punch in the jaw during a fist fight or a sports injury are examples of causes of jaw dislocation. Another cause is injury due to car or motorcycle accidents. RISK FACTORS Although anyone can have a jaw dislocation, some people are more at risk than others. People at increased risk for jaw dislocation include participants in contact sports. SYMPTOMS Symptoms of jaw dislocation can vary, depending on the severity of the dislocation. They can include:  Feeling that your teeth are out of alignment when you bite.  Inability to close your mouth completely.  Drooling.  Extreme pain, with the inability to move your jaw. DIAGNOSIS Your caregiver will feel your temporomandibular joints and ask you to move your jaw. Your caregiver also will feel the inside of your mouth to make sure there are no fractures or cuts (lacerations). TREATMENT Your caregiver will manipulate your jaw to put it back into place (reduction). If you have any jaw fractures from the dislocation, they usually will be held in place with plates and screws or with wiring.  HOME CARE INSTRUCTIONS The following measures can help to reduce pain and hasten the healing process:  Rest your injured joint. Do not move it. Avoid activities similar to the one that caused your injury.  Apply ice to your injured joint for 1 to 2 days after your  reduction or as directed by your caregiver. Applying ice helps to reduce inflammation and pain.  Put ice in a plastic bag.  Place a towel between your skin and the bag.  Leave the ice on for 15-20 minutes at a time, 03-04 times a day.  Take over-the-counter or prescription medication for pain as directed by your caregiver. Also, your caregiver may instruct you to only have certain foods until your jaw heals. These foods may be soft or liquified so that your jaw does not have to move much to eat them. SEEK IMMEDIATE MEDICAL CARE IF:  You have plates and screws or wiring to hold your jaw together that becomes loose or damaged.  You develop drainage from any of the cuts (incisions) where your wires or plates and screws were placed.  Your pain becomes worse rather than better. MAKE SURE YOU:  Understand these instructions.  Will watch your condition.  Will get help right away if you are not doing well or get worse.   This information is not intended to replace advice given to you by your health care provider. Make sure you discuss any questions you have with your health care provider.   Document Released: 12/07/2000 Document Revised: 03/03/2012 Document Reviewed: 05/09/2015 Elsevier Interactive Patient Education [employer]

## 2015-10-01 NOTE — ED Provider Notes (Signed)
I saw and evaluated the patient, I was present for the critical parts of the procedure.   EKG Interpretation None          Reduction of dislocation Date/Time: 12:05 AM Performed by: Deirdre Peer Authorized by: Deirdre Peer Consent: Verbal consent obtained. Risks and benefits: risks, benefits and alternatives were discussed Consent given by: patient Required items: required blood products, implants, devices, and special equipment available Time out: Immediately prior to procedure a "time out" was called to verify the correct patient, procedure, equipment, support staff and site/side marked as required.  Patient sedated: Ketamine/Propofol  Vitals: Vital signs were monitored during sedation. Patient tolerance: Patient tolerated the procedure well with no immediate complications. Joint: Jaw Reduction technique: Inferior and Posterior Traction. Successful reduction.    Deirdre Peer, MD 10/01/15 0006    Procedural sedation Performed by: Derwood Kaplan Consent: Written consent obtained. Risks and benefits: risks, benefits and alternatives were discussed Required items: required blood products, implants, devices, and special equipment available Patient identity confirmed: arm band and provided demographic data Time out: Immediately prior to procedure a "time out" was called to verify the correct patient, procedure, equipment, support staff and site/side marked as required.  Sedation type: moderate (conscious) sedation NPO time confirmed and considedered  Sedatives: KETAMINE and PROPOFOL   Physician Time at Bedside: 35 MINUTES  Vitals: Vital signs were monitored during sedation. Cardiac Monitor, pulse oximeter Patient tolerance: Patient tolerated the procedure well with no immediate complications. Comments: Pt with uneventful recovered. Returned to pre-procedural sedation baseline     Derwood Kaplan, MD 10/01/15 0140

## 2015-10-01 NOTE — ED Notes (Signed)
Pt left with all his belongings and was wheeled out of treatment area.  

## 2015-11-08 ENCOUNTER — Emergency Department (HOSPITAL_COMMUNITY): Payer: PRIVATE HEALTH INSURANCE

## 2015-11-08 ENCOUNTER — Encounter (HOSPITAL_COMMUNITY): Payer: Self-pay | Admitting: *Deleted

## 2015-11-08 ENCOUNTER — Emergency Department (HOSPITAL_COMMUNITY)
Admission: EM | Admit: 2015-11-08 | Discharge: 2015-11-08 | Disposition: A | Discharge status: Home / Self Care | Payer: PRIVATE HEALTH INSURANCE | Attending: Emergency Medicine | Admitting: Emergency Medicine

## 2015-11-08 DIAGNOSIS — J4521 Mild intermittent asthma with (acute) exacerbation: Secondary | ICD-10-CM | POA: Diagnosis not present

## 2015-11-08 DIAGNOSIS — Z79899 Other long term (current) drug therapy: Secondary | ICD-10-CM | POA: Insufficient documentation

## 2015-11-08 DIAGNOSIS — J45909 Unspecified asthma, uncomplicated: Secondary | ICD-10-CM | POA: Diagnosis present

## 2015-11-08 DIAGNOSIS — F17210 Nicotine dependence, cigarettes, uncomplicated: Secondary | ICD-10-CM | POA: Diagnosis not present

## 2015-11-08 DIAGNOSIS — J4520 Mild intermittent asthma, uncomplicated: Secondary | ICD-10-CM

## 2015-11-08 MED ORDER — IPRATROPIUM BROMIDE 0.02 % IN SOLN
0.5000 mg | Freq: Once | RESPIRATORY_TRACT | Status: AC
  Administered 2015-11-08: 0.5 mg via RESPIRATORY_TRACT
  Filled 2015-11-08: qty 2.5

## 2015-11-08 MED ORDER — ALBUTEROL SULFATE (2.5 MG/3ML) 0.083% IN NEBU: 5.0000 mg | INHALATION_SOLUTION | Freq: Four times a day (QID) | RESPIRATORY_TRACT | Status: DC | PRN

## 2015-11-08 MED ORDER — ALBUTEROL SULFATE (2.5 MG/3ML) 0.083% IN NEBU
5.0000 mg | INHALATION_SOLUTION | Freq: Once | RESPIRATORY_TRACT | Status: AC
  Administered 2015-11-08: 5 mg via RESPIRATORY_TRACT
  Filled 2015-11-08: qty 6

## 2015-11-08 MED ORDER — PREDNISONE 20 MG PO TABS
60.0000 mg | ORAL_TABLET | Freq: Once | ORAL | Status: AC
  Administered 2015-11-08: 60 mg via ORAL
  Filled 2015-11-08: qty 3

## 2015-11-08 MED ORDER — ALBUTEROL SULFATE HFA 108 (90 BASE) MCG/ACT IN AERS
1.0000 | INHALATION_SPRAY | Freq: Four times a day (QID) | RESPIRATORY_TRACT | Status: DC | PRN
Start: 2015-11-08 — End: 2015-11-16

## 2015-11-08 NOTE — ED Notes (Addendum)
Per ems pt is from home, c/o asthma attack, has been out of medication for past 3 days. 1 albuterol treatment given en route. Lung sounds clear. Able to speak in full sentences.   Upon rn assessment, pt reports intermittent dry cough x2 weeks, ran out of asthma meds 3 days ago. Reports intermittent pain with deep inspiration 4/10. Slight wheezing heard in lower lobes.

## 2015-11-08 NOTE — ED Provider Notes (Signed)
CSN: [id number]     Arrival date & time 11/08/15  1241 History   First MD Initiated Contact with Patient 11/08/15 1248     Chief Complaint  Patient presents with  . Asthma     (Consider location/radiation/quality/duration/timing/severity/associated sxs/prior Treatment) Patient is a 29 y.o. male presenting with asthma. The history is provided by the patient and medical records.  Asthma Associated symptoms include coughing.     29 year old male with history of asthma, presenting to the ED for asthma exacerbation. Patient states for the past 4 days he has had increased shortness of breath. This is worse with exertional activities. He denies any chest pain, diaphoresis, nausea, or vomiting. He has had a productive cough for the past 2 weeks, white sputum. He denies fever or chills. 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 He was given albuterol nebulizer treatment and route with improvement of his symptoms. He is currently out of all of his medications including albuterol nebulizer solution and rescue inhaler.  Patient requesting a "sandwich and apple juice on arrival".  Past Medical History  Diagnosis Date  . Asthma    History reviewed. No pertinent past surgical history. History reviewed. No pertinent family history. Social History  Substance Use Topics  . Smoking status: Current Every Day Smoker    Types: Cigars  . Smokeless tobacco: Never Used  . Alcohol Use: No    Review of Systems  Respiratory: Positive for cough, shortness of breath and wheezing.   All other systems reviewed and are negative.     Allergies  Review of patient's allergies indicates no known allergies.  Home Medications   Prior to Admission medications   Medication Sig Start Date End Date Taking? Authorizing Provider  acetaminophen (TYLENOL) 500 MG tablet Take 1,000 mg by mouth every 6 (six) hours as needed for mild pain, moderate pain or headache.    Yes Historical Provider,  MD  albuterol (PROVENTIL HFA;VENTOLIN HFA) 108 (90 BASE) MCG/ACT inhaler Inhale 2 puffs into the lungs every 6 (six) hours as needed for wheezing or shortness of breath.   Yes Historical Provider, MD  albuterol (PROVENTIL) (2.5 MG/3ML) 0.083% nebulizer solution Take 2.5 mg by nebulization every 6 (six) hours as needed for wheezing or shortness of breath.   Yes Historical Provider, MD  naproxen sodium (ANAPROX) 220 MG tablet Take 440 mg by mouth every 12 (twelve) hours as needed (pain).   Yes Historical Provider, MD  etodolac (LODINE) 500 MG tablet Take 1 tablet (500 mg total) by mouth 2 (two) times daily. Patient not taking: Reported on 11/08/2015 09/19/15   Linwood DibblesJon Knapp, MD  HYDROcodone-acetaminophen (NORCO/VICODIN) 5-325 MG tablet Take 1 tablet by mouth every 6 (six) hours as needed. Patient not taking: Reported on 11/08/2015 10/01/15   Derwood KaplanAnkit Nanavati, MD  ibuprofen (ADVIL,MOTRIN) 600 MG tablet Take 1 tablet (600 mg total) by mouth every 6 (six) hours as needed. Patient not taking: Reported on 11/08/2015 10/01/15   Derwood KaplanAnkit Nanavati, MD   BP 111/79 mmHg  Pulse 66  Temp(Src) 97.7 F (36.5 C) (Oral)  Resp 16  SpO2 100%   Physical Exam  Constitutional: He is oriented to person, place, and time. He appears well-developed and well-nourished. No distress.  HENT:  Head: Normocephalic and atraumatic.  Mouth/Throat: Oropharynx is clear and moist.  Eyes: Conjunctivae and EOM are normal. Pupils are equal, round, and reactive to light.  Neck: Normal range of motion. Neck supple.  Cardiovascular: Normal rate, regular rhythm and normal  heart sounds.   Pulmonary/Chest: Effort normal. No respiratory distress. He has wheezes.  Faint wheeze noted at bases; no distress, speaking in full sentences without difficulty  Abdominal: Soft. Bowel sounds are normal.  Musculoskeletal: Normal range of motion.  Neurological: He is alert and oriented to person, place, and time.  Skin: Skin is warm and dry. He is not  diaphoretic.  Psychiatric: He has a normal mood and affect.  Nursing note and vitals reviewed.   ED Course  Procedures (including critical care time) Labs Review Labs Reviewed - No data to display  Imaging Review Dg Chest 2 View  11/08/2015  CLINICAL DATA:  Increasing cough and shortness of breath, smoker, personal history of asthma EXAM: CHEST  2 VIEW COMPARISON:  None FINDINGS: Normal heart size, mediastinal contours, and pulmonary vascularity. Lungs mildly hyperaerated but clear. No infiltrate, pleural effusion or pneumothorax. Bones unremarkable. IMPRESSION: Hyperinflation consistent with history of asthma. No acute infiltrate. Electronically Signed   By: Ulyses Southward M.D.   On: 11/08/2015 13:43   I have personally reviewed and evaluated these images and lab results as part of my medical decision-making.   EKG Interpretation None      MDM   Final diagnoses:  Asthma, mild intermittent, uncomplicated   29 year old male here with cough and shortness breath. He has history of asthma. Patient is afebrile, nontoxic. He is in no acute respiratory distress. He has faint expiratory wheezes at bases. His vital signs are stable on room air. He is requesting to eat and drink. 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  Will obtain chest x-ray, additional nebs given.  Chest x-ray is negative for acute findings. After second round of nebulizer treatments patient states he is feeling significantly better. He is currently eating [place] sandwich and drinking juice in the room, no distress. His vital signs remained stable on room air. Will refill his home asthma medications. Patient does not currently have a PCP, he will be referred to the wellness clinic for follow-up.  Discussed plan with patient, he/she acknowledged understanding and agreed with plan of care.  Return precautions given for new or worsening symptoms.  Garlon Hatchet, PA-C 11/08/15 1506  Cathren Laine,  MD 11/09/15 [phone]

## 2015-11-08 NOTE — Discharge Instructions (Signed)
Take the prescribed medication as directed.  Continue albuterol nebulizer treatments every 4-6 hours as needed for wheezing/shortness of breath. [facility]-- call to make appointment. Return to the ED for new or worsening symptoms.

## 2015-11-08 NOTE — ED Notes (Signed)
Pt was given bus pass upon request to get home.

## 2015-11-08 NOTE — ED Notes (Signed)
Bed: [id number] Expected date:  Expected time:  Means of arrival:  Comments: EMS- 29yo M, SOB

## 2015-11-15 ENCOUNTER — Emergency Department (HOSPITAL_COMMUNITY)
Admission: EM | Admit: 2015-11-15 | Discharge: 2015-11-15 | Disposition: A | Discharge status: Home / Self Care | Payer: PRIVATE HEALTH INSURANCE | Attending: Emergency Medicine | Admitting: Emergency Medicine

## 2015-11-15 ENCOUNTER — Encounter (HOSPITAL_COMMUNITY): Payer: Self-pay

## 2015-11-15 ENCOUNTER — Emergency Department (HOSPITAL_COMMUNITY): Payer: PRIVATE HEALTH INSURANCE

## 2015-11-15 DIAGNOSIS — Y9389 Activity, other specified: Secondary | ICD-10-CM | POA: Insufficient documentation

## 2015-11-15 DIAGNOSIS — Y998 Other external cause status: Secondary | ICD-10-CM | POA: Insufficient documentation

## 2015-11-15 DIAGNOSIS — Z79899 Other long term (current) drug therapy: Secondary | ICD-10-CM | POA: Diagnosis not present

## 2015-11-15 DIAGNOSIS — S62316A Displaced fracture of base of fifth metacarpal bone, right hand, initial encounter for closed fracture: Secondary | ICD-10-CM | POA: Diagnosis not present

## 2015-11-15 DIAGNOSIS — Y9289 Other specified places as the place of occurrence of the external cause: Secondary | ICD-10-CM | POA: Diagnosis not present

## 2015-11-15 DIAGNOSIS — F17210 Nicotine dependence, cigarettes, uncomplicated: Secondary | ICD-10-CM | POA: Insufficient documentation

## 2015-11-15 DIAGNOSIS — S6991XA Unspecified injury of right wrist, hand and finger(s), initial encounter: Secondary | ICD-10-CM | POA: Diagnosis present

## 2015-11-15 DIAGNOSIS — J45909 Unspecified asthma, uncomplicated: Secondary | ICD-10-CM | POA: Diagnosis not present

## 2015-11-15 DIAGNOSIS — S62309A Unspecified fracture of unspecified metacarpal bone, initial encounter for closed fracture: Secondary | ICD-10-CM

## 2015-11-15 MED ORDER — IBUPROFEN 600 MG PO TABS: 600.0000 mg | ORAL_TABLET | Freq: Four times a day (QID) | ORAL | Status: AC | PRN
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
## 2015-11-15 NOTE — Discharge Instructions (Signed)
°Cast or Splint Care  ° ° °Casts and splints support injured limbs and keep bones from moving while they heal. It is important to care for your cast or splint at home.  °HOME CARE INSTRUCTIONS  °Keep the cast or splint uncovered during the drying period. It can take 24 to 48 hours to dry if it is made of plaster. A fiberglass cast will dry in less than 1 hour.  °Do not rest the cast on anything harder than a pillow for the first 24 hours.  °Do not put weight on your injured limb or apply pressure to the cast until your health care provider gives you permission.  °Keep the cast or splint dry. Wet casts or splints can lose their shape and may not support the limb as well. A wet cast that has lost its shape can also create harmful pressure on your skin when it dries. Also, wet skin can become infected.  °Cover the cast or splint with a plastic bag when bathing or when out in the rain or snow. If the cast is on the trunk of the body, take sponge baths until the cast is removed.  °If your cast does become wet, dry it with a towel or a blow dryer on the cool setting only. °Keep your cast or splint clean. Soiled casts may be wiped with a moistened cloth.  °Do not place any hard or soft foreign objects under your cast or splint, such as cotton, toilet paper, lotion, or powder.  °Do not try to scratch the skin under the cast with any object. The object could get stuck inside the cast. Also, scratching could lead to an infection. If itching is a problem, use a blow dryer on a cool setting to relieve discomfort.  °Do not trim or cut your cast or remove padding from inside of it.  °Exercise all joints next to the injury that are not immobilized by the cast or splint. For example, if you have a long leg cast, exercise the hip joint and toes. If you have an arm cast or splint, exercise the shoulder, elbow, thumb, and fingers.  °Elevate your injured arm or leg on 1 or 2 pillows for the first 1 to 3 days to decrease swelling and  pain. It is best if you can comfortably elevate your cast so it is higher than your heart. °SEEK MEDICAL CARE IF:  °Your cast or splint cracks.  °Your cast or splint is too tight or too loose.  °You have unbearable itching inside the cast.  °Your cast becomes wet or develops a soft spot or area.  °You have a bad smell coming from inside your cast.  °You get an object stuck under your cast.  °Your skin around the cast becomes red or raw.  °You have new pain or worsening pain after the cast has been applied. °SEEK IMMEDIATE MEDICAL CARE IF:  °You have fluid leaking through the cast.  °You are unable to move your fingers or toes.  °You have discolored (blue or white), cool, painful, or very swollen fingers or toes beyond the cast.  °You have tingling or numbness around the injured area.  °You have severe pain or pressure under the cast.  °You have any difficulty with your breathing or have shortness of breath.  °You have chest pain. °This information is not intended to replace advice given to you by your health care provider. Make sure you discuss any questions you have with your health care provider.  °  Document Released: 12/07/2000 Document Revised: 09/30/2013 Document Reviewed: 06/18/2013  °Elsevier Interactive Patient Education ©2016 [employer]  ° °

## 2015-11-15 NOTE — ED Notes (Signed)
Bed: WA31 Expected date:  Expected time:  Means of arrival:  Comments: 
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
## 2015-11-16 ENCOUNTER — Emergency Department (HOSPITAL_COMMUNITY)
Admission: EM | Admit: 2015-11-16 | Discharge: 2015-11-16 | Disposition: A | Discharge status: Home / Self Care | Payer: PRIVATE HEALTH INSURANCE | Attending: Emergency Medicine | Admitting: Emergency Medicine

## 2015-11-16 ENCOUNTER — Encounter (HOSPITAL_COMMUNITY): Payer: Self-pay | Admitting: Emergency Medicine

## 2015-11-16 DIAGNOSIS — J45901 Unspecified asthma with (acute) exacerbation: Secondary | ICD-10-CM | POA: Insufficient documentation

## 2015-11-16 DIAGNOSIS — J45909 Unspecified asthma, uncomplicated: Secondary | ICD-10-CM | POA: Diagnosis present

## 2015-11-16 DIAGNOSIS — F17210 Nicotine dependence, cigarettes, uncomplicated: Secondary | ICD-10-CM | POA: Diagnosis not present

## 2015-11-16 DIAGNOSIS — Z79899 Other long term (current) drug therapy: Secondary | ICD-10-CM | POA: Diagnosis not present

## 2015-11-16 MED ORDER — PREDNISONE 20 MG PO TABS
60.0000 mg | ORAL_TABLET | Freq: Once | ORAL | Status: AC
  Administered 2015-11-16: 60 mg via ORAL
  Filled 2015-11-16: qty 3

## 2015-11-16 MED ORDER — ALBUTEROL SULFATE (2.5 MG/3ML) 0.083% IN NEBU
2.5000 mg | INHALATION_SOLUTION | Freq: Once | RESPIRATORY_TRACT | Status: AC
  Administered 2015-11-16: 2.5 mg via RESPIRATORY_TRACT
  Filled 2015-11-16: qty 3

## 2015-11-16 MED ORDER — ALBUTEROL SULFATE HFA 108 (90 BASE) MCG/ACT IN AERS
1.0000 | INHALATION_SPRAY | Freq: Four times a day (QID) | RESPIRATORY_TRACT | Status: AC | PRN
Start: 2015-11-16 — End: ?

## 2015-11-16 MED ORDER — IPRATROPIUM-ALBUTEROL 0.5-2.5 (3) MG/3ML IN SOLN
3.0000 mL | Freq: Once | RESPIRATORY_TRACT | Status: AC
  Administered 2015-11-16: 3 mL via RESPIRATORY_TRACT
  Filled 2015-11-16: qty 3

## 2015-11-16 MED ORDER — ALBUTEROL SULFATE HFA 108 (90 BASE) MCG/ACT IN AERS
2.0000 | INHALATION_SPRAY | RESPIRATORY_TRACT | Status: DC | PRN
  Administered 2015-11-16: 2 via RESPIRATORY_TRACT
  Filled 2015-11-16: qty 6.7

## 2015-11-16 NOTE — ED Notes (Signed)
AVS explained in detail. Demonstrated correct use of inhaler. Given work note. No bus passes available on unit at this time-patient aware. Ambulatory with steady gait. RR even/unlabored. No SOB noted. No other c/c.

## 2015-11-16 NOTE — ED Provider Notes (Signed)
CSN: [id number]     Arrival date & time 11/16/15  0800 History   First MD Initiated Contact with Patient 11/16/15 [phone]     Chief Complaint  Patient presents with  . Asthma     (Consider location/radiation/quality/duration/timing/severity/associated sxs/prior Treatment) Patient is a 29 y.o. male presenting with asthma. The history is provided by the patient (The patient complains of wheezing throughout. He has a history of asthma).  Asthma This is a recurrent problem. The current episode started 6 to 12 hours ago. The problem occurs constantly. The problem has not changed since onset.Pertinent negatives include no chest pain, no abdominal pain and no headaches. Nothing aggravates the symptoms. The symptoms are relieved by medications.    Past Medical History  Diagnosis Date  . Asthma    History reviewed. No pertinent past surgical history. No family history on file. Social History  Substance Use Topics  . Smoking status: Current Every Day Smoker    Types: Cigars  . Smokeless tobacco: Never Used  . Alcohol Use: No    Review of Systems  Constitutional: Negative for appetite change and fatigue.  HENT: Negative for congestion, ear discharge and sinus pressure.   Eyes: Negative for discharge.  Respiratory: Positive for wheezing. Negative for cough.   Cardiovascular: Negative for chest pain.  Gastrointestinal: Negative for abdominal pain and diarrhea.  Genitourinary: Negative for frequency and hematuria.  Musculoskeletal: Negative for back pain.  Skin: Negative for rash.  Neurological: Negative for seizures and headaches.  Psychiatric/Behavioral: Negative for hallucinations.      Allergies  Review of patient's allergies indicates no known allergies.  Home Medications   Prior to Admission medications   Medication Sig Start Date End Date Taking? Authorizing Provider  acetaminophen (TYLENOL) 500 MG tablet Take 1,000 mg by mouth every 6 (six) hours as needed for mild pain,  moderate pain or headache.    Yes Historical Provider, MD  albuterol (PROVENTIL HFA;VENTOLIN HFA) 108 (90 BASE) MCG/ACT inhaler Inhale 1-2 puffs into the lungs every 6 (six) hours as needed for wheezing. 11/08/15  Yes Garlon HatchetLisa M Sanders, PA-C  albuterol (PROVENTIL) (2.5 MG/3ML) 0.083% nebulizer solution Take 6 mLs (5 mg total) by nebulization every 6 (six) hours as needed for wheezing or shortness of breath. 11/08/15  Yes Garlon HatchetLisa M Sanders, PA-C  naproxen sodium (ANAPROX) 220 MG tablet Take 440 mg by mouth every 12 (twelve) hours as needed (pain).   Yes Historical Provider, MD  etodolac (LODINE) 500 MG tablet Take 1 tablet (500 mg total) by mouth 2 (two) times daily. Patient not taking: Reported on 11/08/2015 09/19/15   Linwood DibblesJon Knapp, MD  HYDROcodone-acetaminophen (NORCO/VICODIN) 5-325 MG tablet Take 1 tablet by mouth every 6 (six) hours as needed. Patient not taking: Reported on 11/08/2015 10/01/15   Derwood KaplanAnkit Nanavati, MD  ibuprofen (ADVIL,MOTRIN) 600 MG tablet Take 1 tablet (600 mg total) by mouth every 6 (six) hours as needed for mild pain or moderate pain. Patient not taking: Reported on 11/16/2015 11/15/15   Earley FavorGail Schulz, NP   BP 109/61 mmHg  Pulse 65  Temp(Src) 97.5 F (36.4 C) (Oral)  Resp 13  Ht 5\' 9"  (1.753 m)  Wt 135 lb (61.236 kg)  BMI 19.93 kg/m2  SpO2 97% Physical Exam  Constitutional: He is oriented to person, place, and time. He appears well-developed.  HENT:  Head: Normocephalic.  Eyes: Conjunctivae and EOM are normal. No scleral icterus.  Neck: Neck supple. No thyromegaly present.  Cardiovascular: Normal rate and regular rhythm.  Exam reveals  no gallop and no friction rub.   No murmur heard. Pulmonary/Chest: No stridor. He has wheezes. He has no rales. He exhibits no tenderness.  Abdominal: He exhibits no distension. There is no tenderness. There is no rebound.  Musculoskeletal: Normal range of motion. He exhibits no edema.  Lymphadenopathy:    He has no cervical adenopathy.   Neurological: He is oriented to person, place, and time. He exhibits normal muscle tone. Coordination normal.  Skin: No rash noted. No erythema.  Psychiatric: He has a normal mood and affect. His behavior is normal.    ED Course  Procedures (including critical care time) Labs Review Labs Reviewed - No data to display  Imaging Review Dg Hand Complete Right  11/15/2015  CLINICAL DATA:  Punching injury, pain and swelling over the fifth metacarpal EXAM: RIGHT HAND - COMPLETE 3+ VIEW COMPARISON:  None. FINDINGS: There is an acute displaced and angulated fracture of the right fifth metacarpal distally. This is consistent with a boxer's fracture. Other metacarpals appear intact. No other acute osseous finding. No associated subluxation or dislocation of the MCP joint. No joint abnormality. Mild soft tissue swelling over the fracture. IMPRESSION: Acute angulated and displaced right fifth metacarpal fracture distally. Electronically Signed   By: Judie Petit.  Shick M.D.   On: 11/15/2015 19:26   I have personally reviewed and evaluated these images and lab results as part of my medical decision-making.   EKG Interpretation None      MDM   Final diagnoses:  None    Patient with mild to moderate asthma exacerbation. Patient improved with 2 negative treatments. He was also given 1 dose of prednisone. Patient will be discharged with albuterol inhaler and told to follow-up.    Bethann Berkshire, MD 11/16/15 1050

## 2015-11-16 NOTE — Discharge Instructions (Signed)
Follow up with a family md as needed °

## 2015-11-16 NOTE — ED Notes (Signed)
Bed: WA09 Expected date:  Expected time:  Means of arrival:  Comments: EMS- 29yo M, SOB/asthma attack

## 2015-11-16 NOTE — ED Notes (Signed)
Per EMS-presented with asthma attack starting at 0600 (hx asthma). Given 5 mg Albuterol. Pt is out of his Albuterol inhaler and told EMS asthma exacerbation returns within 15 minutes post-treatment. No other c/c. VS: 100/72 SpO2 99% on RA HR 63 RR 18.

## 2016-02-01 IMAGING — CR DG CHEST 2V
2 series · 2 of 2 positions shown · non-contrast
Comparison: None

CLINICAL DATA: Increasing cough and shortness of breath, smoker,
personal history of asthma

EXAM:
CHEST  2 VIEW

[w chest pa]
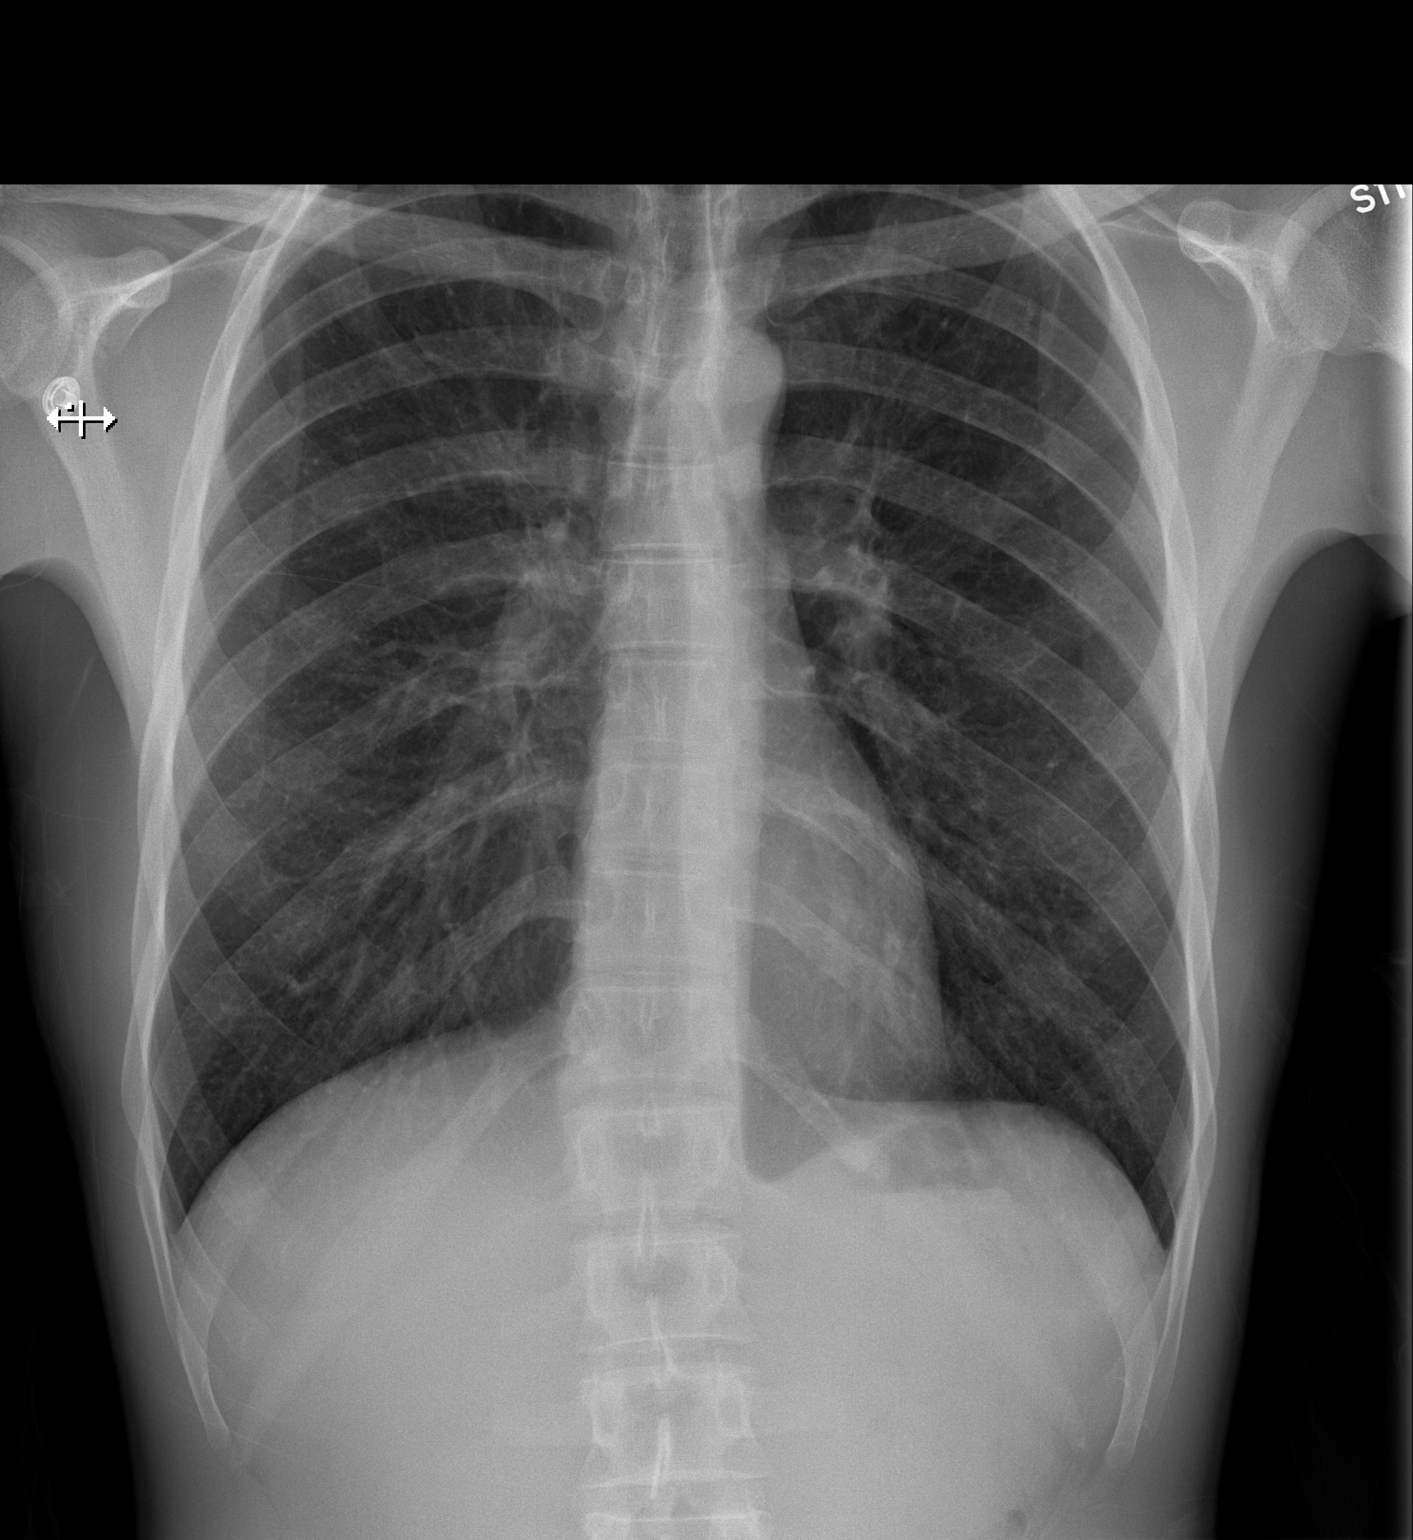

[w chest lat]
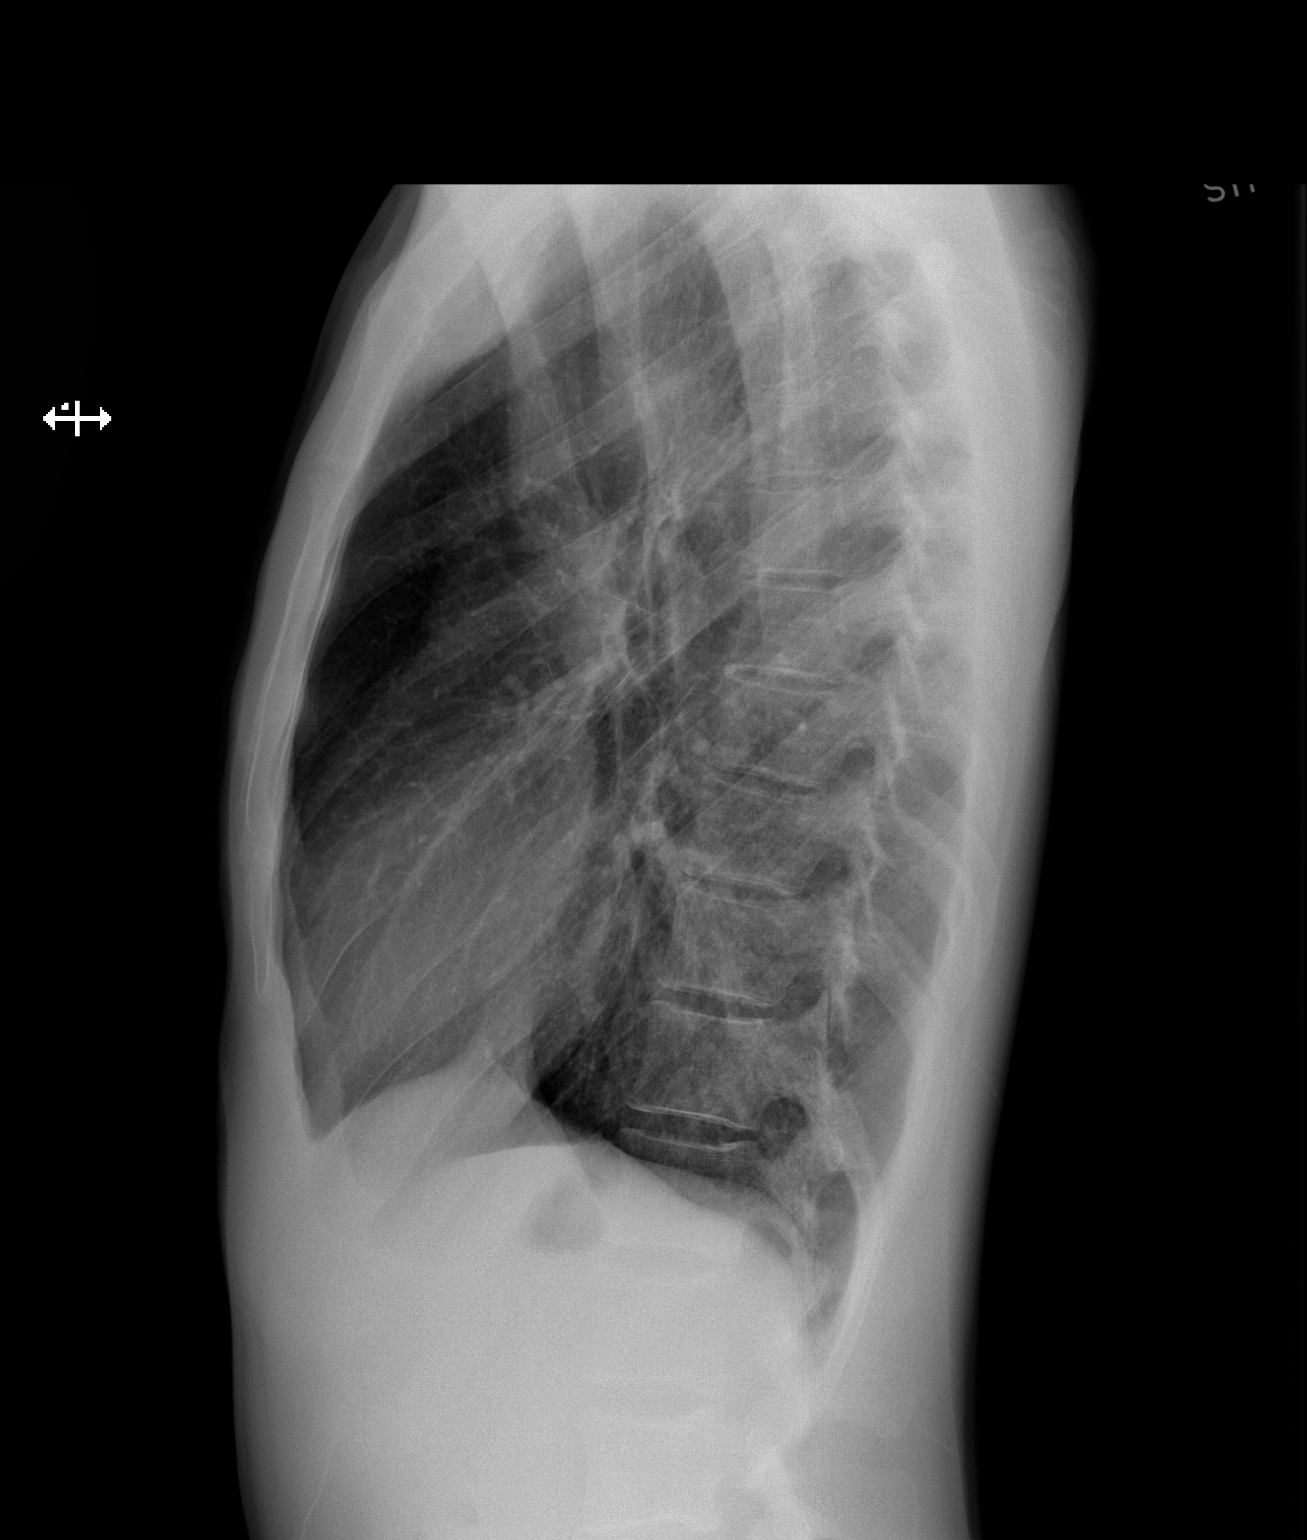

[2 of 2 positions shown; findings below may reference images not displayed]

FINDINGS: Normal heart size, mediastinal contours, and pulmonary vascularity.

Lungs mildly hyperaerated but clear.

No infiltrate, pleural effusion or pneumothorax.

Bones unremarkable.
IMPRESSION: Hyperinflation consistent with history of asthma.

No acute infiltrate.
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
